# Patient Record
Sex: Male | Born: 1960 | Race: White | Hispanic: No | Marital: Married | Smoking: Former smoker
Health system: Southern US, Community
[De-identification: ages and names within clinical notes are randomized; demographics above are authoritative.]

## PROBLEM LIST (undated history)

## (undated) DIAGNOSIS — K5790 Diverticulosis of intestine, part unspecified, without perforation or abscess without bleeding: Secondary | ICD-10-CM

## (undated) DIAGNOSIS — B9681 Helicobacter pylori [H. pylori] as the cause of diseases classified elsewhere: Secondary | ICD-10-CM

## (undated) DIAGNOSIS — K2970 Gastritis, unspecified, without bleeding: Secondary | ICD-10-CM

## (undated) DIAGNOSIS — F419 Anxiety disorder, unspecified: Secondary | ICD-10-CM

## (undated) DIAGNOSIS — M1990 Unspecified osteoarthritis, unspecified site: Secondary | ICD-10-CM

## (undated) DIAGNOSIS — K219 Gastro-esophageal reflux disease without esophagitis: Secondary | ICD-10-CM

## (undated) DIAGNOSIS — I10 Essential (primary) hypertension: Secondary | ICD-10-CM

## (undated) DIAGNOSIS — K648 Other hemorrhoids: Secondary | ICD-10-CM

## (undated) DIAGNOSIS — F329 Major depressive disorder, single episode, unspecified: Secondary | ICD-10-CM

## (undated) DIAGNOSIS — J45909 Unspecified asthma, uncomplicated: Secondary | ICD-10-CM

## (undated) DIAGNOSIS — K449 Diaphragmatic hernia without obstruction or gangrene: Secondary | ICD-10-CM

## (undated) DIAGNOSIS — D126 Benign neoplasm of colon, unspecified: Secondary | ICD-10-CM

## (undated) DIAGNOSIS — K5792 Diverticulitis of intestine, part unspecified, without perforation or abscess without bleeding: Secondary | ICD-10-CM

## (undated) DIAGNOSIS — K222 Esophageal obstruction: Secondary | ICD-10-CM

## (undated) DIAGNOSIS — F32A Depression, unspecified: Secondary | ICD-10-CM

## (undated) HISTORY — DX: Gastro-esophageal reflux disease without esophagitis: K21.9

## (undated) HISTORY — DX: Helicobacter pylori (H. pylori) as the cause of diseases classified elsewhere: B96.81

## (undated) HISTORY — DX: Anxiety disorder, unspecified: F41.9

## (undated) HISTORY — DX: Diaphragmatic hernia without obstruction or gangrene: K44.9

## (undated) HISTORY — DX: Diverticulitis of intestine, part unspecified, without perforation or abscess without bleeding: K57.92

## (undated) HISTORY — PX: HEMORRHOID BANDING: SHX5850

## (undated) HISTORY — PX: UPPER GASTROINTESTINAL ENDOSCOPY: SHX188

## (undated) HISTORY — DX: Esophageal obstruction: K22.2

## (undated) HISTORY — DX: Major depressive disorder, single episode, unspecified: F32.9

## (undated) HISTORY — DX: Benign neoplasm of colon, unspecified: D12.6

## (undated) HISTORY — PX: TONSILLECTOMY AND ADENOIDECTOMY: SUR1326

## (undated) HISTORY — DX: Gastritis, unspecified, without bleeding: K29.70

## (undated) HISTORY — DX: Unspecified osteoarthritis, unspecified site: M19.90

## (undated) HISTORY — DX: Depression, unspecified: F32.A

## (undated) HISTORY — DX: Diverticulosis of intestine, part unspecified, without perforation or abscess without bleeding: K57.90

## (undated) HISTORY — DX: Unspecified asthma, uncomplicated: J45.909

## (undated) HISTORY — DX: Essential (primary) hypertension: I10

## (undated) HISTORY — PX: COLONOSCOPY: SHX174

## (undated) HISTORY — DX: Other hemorrhoids: K64.8

---

## 1985-10-07 HISTORY — PX: SPINAL FUSION: SHX223

## 2009-11-15 ENCOUNTER — Encounter (INDEPENDENT_AMBULATORY_CARE_PROVIDER_SITE_OTHER): Payer: Self-pay | Admitting: *Deleted

## 2010-11-06 NOTE — Letter (Signed)
Summary: Colonoscopy Date Change Letter  [facility]  [address] [place], [place] [address]   Phone: [phone]  Fax: [phone]      November 15, 2009 MRN: [id number]   Devon Bryant [address] [place], [place]  [address]   Dear Mr. PUGLIA,   Previously you were recommended to have a repeat colonoscopy around this time. Your chart was recently reviewed by Dr. Judie Petit T. Russella Dar of [facility]. Follow up colonoscopy is now recommended in March 2014. This revised recommendation is based on current, nationally recognized guidelines for colorectal cancer screening and polyp surveillance. These guidelines are endorsed by the American Cancer Society, [employer] on Colorectal Cancer as well as numerous other major medical organizations.  Please understand that our recommendation assumes that you do not have any new symptoms such as bleeding, a change in bowel habits, anemia, or significant abdominal discomfort. If you do have any concerning GI symptoms or want to discuss the guideline recommendations, please call to arrange an office visit at your earliest convenience. Otherwise we will keep you in our reminder system and contact you 1-2 months prior to the date listed above to schedule your next colonoscopy.  Thank you,  Judie Petit T. Russella Dar, M.D.  [facility] [phone]

## 2012-01-14 ENCOUNTER — Encounter: Payer: Self-pay | Admitting: Gastroenterology

## 2012-01-21 ENCOUNTER — Institutional Professional Consult (permissible substitution): Payer: Self-pay | Admitting: Cardiology

## 2012-02-06 ENCOUNTER — Ambulatory Visit: Payer: Self-pay | Admitting: Gastroenterology

## 2012-08-26 ENCOUNTER — Encounter: Payer: Self-pay | Admitting: Gastroenterology

## 2012-10-09 ENCOUNTER — Ambulatory Visit: Payer: Self-pay | Admitting: Gastroenterology

## 2012-12-04 ENCOUNTER — Encounter: Payer: Self-pay | Admitting: Gastroenterology

## 2014-01-17 ENCOUNTER — Telehealth: Payer: Self-pay | Admitting: Gastroenterology

## 2014-01-17 ENCOUNTER — Encounter: Payer: Self-pay | Admitting: Gastroenterology

## 2014-01-17 NOTE — Telephone Encounter (Signed)
Patient appt moved up to 02/14/14 330

## 2014-02-14 ENCOUNTER — Encounter: Payer: Self-pay | Admitting: Gastroenterology

## 2014-02-14 ENCOUNTER — Ambulatory Visit (INDEPENDENT_AMBULATORY_CARE_PROVIDER_SITE_OTHER): Payer: PRIVATE HEALTH INSURANCE | Admitting: Gastroenterology

## 2014-02-14 VITALS — BP 106/78 | HR 72 | Ht 72.0 in | Wt 215.4 lb

## 2014-02-14 DIAGNOSIS — Z1211 Encounter for screening for malignant neoplasm of colon: Secondary | ICD-10-CM

## 2014-02-14 DIAGNOSIS — K429 Umbilical hernia without obstruction or gangrene: Secondary | ICD-10-CM

## 2014-02-14 DIAGNOSIS — R1319 Other dysphagia: Secondary | ICD-10-CM

## 2014-02-14 DIAGNOSIS — K921 Melena: Secondary | ICD-10-CM

## 2014-02-14 DIAGNOSIS — K5900 Constipation, unspecified: Secondary | ICD-10-CM

## 2014-02-14 DIAGNOSIS — K219 Gastro-esophageal reflux disease without esophagitis: Secondary | ICD-10-CM

## 2014-02-14 MED ORDER — OMEPRAZOLE 40 MG PO CPDR: 40.0000 mg | DELAYED_RELEASE_CAPSULE | Freq: Every day | ORAL | Status: DC

## 2014-02-14 MED ORDER — PEG-KCL-NACL-NASULF-NA ASC-C 100 G PO SOLR: 1.0000 | Freq: Once | ORAL | Status: DC

## 2014-02-14 NOTE — Progress Notes (Signed)
    History of Present Illness: This is a 53 year old male who has multiple gastrointestinal complaints. He notes intermittent mild constipation for the past year. He occasionally notes small amounts of bright red blood with bowel movements especially when straining. He has had long-term GERD with frequent reflux symptoms and takes ranitidine 150 mg on occasion. For the past year he has had worsening problems with intermittent solid food dysphagia mainly meats and breads. He has had a self-induced vomiting on a few occasions. He relates an umbilical hernia that has increased in size. He notes occasional wheezing and shortness of breath. He states he underwent colonoscopy with me about 12 years ago but does not recall the findings. We can't locate his records at this time. Denies weight loss, abdominal pain, diarrhea, change in stool caliber, melena, hematochezia, nausea, vomiting, chest pain.  Review of Systems: Pertinent positive and negative review of systems were noted in the above HPI section. All other review of systems were otherwise negative.  Current Medications, Allergies, Past Medical History, Past Surgical History, Family History and Social History were reviewed in [employer] record.  Physical Exam: General: Well developed , well nourished, no acute distress Head: Normocephalic and atraumatic Eyes:  sclerae anicteric, EOMI Ears: Normal auditory acuity Mouth: No deformity or lesions Neck: Supple, no masses or thyromegaly Lungs: Clear throughout to auscultation Heart: Regular rate and rhythm; no murmurs, rubs or bruits Abdomen: Soft, non tender and non distended. No masses, hepatosplenomegaly noted. Small tender umbilicus hernia. Normal Bowel sounds Rectal: Deferred to colonoscopy Musculoskeletal: Symmetrical with no gross deformities  Skin: No lesions on visible extremities Pulses:  Normal pulses noted Extremities: No clubbing, cyanosis, edema or deformities  noted Neurological: Alert oriented x 4, grossly nonfocal Cervical Nodes:  No significant cervical adenopathy Inguinal Nodes: No significant inguinal adenopathy Psychological:  Alert and cooperative. Normal mood and affect  Assessment and Recommendations:  1. GERD with progressive solid food dysphagia. Rule out peptic stricture. Begin omeprazole 40 mg daily and standard antireflux measures. Schedule barium esophagram. Schedule endoscopy with possible dilation. The risks, benefits, and alternatives to endoscopy with possible biopsy and possible dilation were discussed with the patient and they consent to proceed.   2. Colorectal cancer screening, average risk. I suspect his occasional small volume rectal bleeding is secondary to a benign anorectal source. Increase dietary fiber and water intake. The risks, benefits, and alternatives to colonoscopy with possible biopsy and possible polypectomy were discussed with the patient and they consent to proceed.   3. Small umbilical hernia. Surgical referral after above gastrointestinal evaluation is completed.

## 2014-02-14 NOTE — Patient Instructions (Signed)
We have sent the following medications to your pharmacy for you to pick up at your convenience:omeprazole.  Patient advised to avoid spicy, acidic, citrus, chocolate, mints, fruit and fruit juices.  Limit the intake of caffeine, alcohol and Soda.  Don't exercise too soon after eating.  Don't lie down within 3-4 hours of eating.  Elevate the head of your bed.  Avoid meats and breads until Upper Endoscopy.   You have been scheduled for a Barium Esophogram at [facility] Radiology (1st floor of the hospital) on 02/25/14 at 9:30am. Please arrive 15 minutes prior to your appointment for registration. Make certain not to have anything to eat or drink 6 hours prior to your test. If you need to reschedule for any reason, please contact radiology at [phone] to do so. __________________________________________________________________ A barium swallow is an examination that concentrates on views of the esophagus. This tends to be a double contrast exam (barium and two liquids which, when combined, create a gas to distend the wall of the oesophagus) or single contrast (non-ionic iodine based). The study is usually tailored to your symptoms so a good history is essential. Attention is paid during the study to the form, structure and configuration of the esophagus, looking for functional disorders (such as aspiration, dysphagia, achalasia, motility and reflux) EXAMINATION You may be asked to change into a gown, depending on the type of swallow being performed. A radiologist and radiographer will perform the procedure. The radiologist will advise you of the type of contrast selected for your procedure and direct you during the exam. You will be asked to stand, sit or lie in several different positions and to hold a small amount of fluid in your mouth before being asked to swallow while the imaging is performed .In some instances you may be asked to swallow barium coated marshmallows to assess the motility of a solid  food bolus. The exam can be recorded as a digital or video fluoroscopy procedure. POST PROCEDURE It will take 1-2 days for the barium to pass through your system. To facilitate this, it is important, unless otherwise directed, to increase your fluids for the next 24-48hrs and to resume your normal diet.  This test typically takes about 30 minutes to perform. __________________________________________________________________________________  Devon Bryant have been scheduled for an endoscopy and colonoscopy with propofol. Please follow the written instructions given to you at your visit today. Please pick up your prep at the pharmacy within the next 1-3 days. If you use inhalers (even only as needed), please bring them with you on the day of your procedure. Your physician has requested that you go to www.startemmi.com and enter the access code given to you at your visit today. This web site gives a general overview about your procedure. However, you should still follow specific instructions given to you by our office regarding your preparation for the procedure.  Please make an appointment with your Primary Care Physician to be evaluated for shortness of breath and wheezing.  Thank you for choosing me and [facility].  Pricilla Riffle. Dagoberto Ligas., MD., Mar[facility] Regal

## 2014-02-25 ENCOUNTER — Ambulatory Visit (HOSPITAL_COMMUNITY)
Admission: RE | Admit: 2014-02-25 | Discharge: 2014-02-25 | Disposition: A | Discharge status: Home / Self Care | Payer: PRIVATE HEALTH INSURANCE | Source: Ambulatory Visit | Attending: Gastroenterology | Admitting: Gastroenterology

## 2014-02-25 DIAGNOSIS — K219 Gastro-esophageal reflux disease without esophagitis: Secondary | ICD-10-CM | POA: Insufficient documentation

## 2014-02-25 DIAGNOSIS — K449 Diaphragmatic hernia without obstruction or gangrene: Secondary | ICD-10-CM | POA: Insufficient documentation

## 2014-02-25 DIAGNOSIS — R1319 Other dysphagia: Secondary | ICD-10-CM

## 2014-02-25 DIAGNOSIS — R1310 Dysphagia, unspecified: Secondary | ICD-10-CM | POA: Insufficient documentation

## 2014-03-07 DIAGNOSIS — D126 Benign neoplasm of colon, unspecified: Secondary | ICD-10-CM

## 2014-03-07 HISTORY — DX: Benign neoplasm of colon, unspecified: D12.6

## 2014-03-09 ENCOUNTER — Ambulatory Visit: Payer: Self-pay | Admitting: Gastroenterology

## 2014-03-29 ENCOUNTER — Encounter: Payer: Self-pay | Admitting: Gastroenterology

## 2014-03-29 ENCOUNTER — Ambulatory Visit (AMBULATORY_SURGERY_CENTER): Payer: PRIVATE HEALTH INSURANCE | Admitting: Gastroenterology

## 2014-03-29 VITALS — BP 109/70 | HR 60 | Temp 97.0°F | Resp 16 | Ht 72.0 in | Wt 215.0 lb

## 2014-03-29 DIAGNOSIS — R1319 Other dysphagia: Secondary | ICD-10-CM

## 2014-03-29 DIAGNOSIS — K2990 Gastroduodenitis, unspecified, without bleeding: Secondary | ICD-10-CM

## 2014-03-29 DIAGNOSIS — D126 Benign neoplasm of colon, unspecified: Secondary | ICD-10-CM

## 2014-03-29 DIAGNOSIS — Z1211 Encounter for screening for malignant neoplasm of colon: Secondary | ICD-10-CM

## 2014-03-29 DIAGNOSIS — K2970 Gastritis, unspecified, without bleeding: Secondary | ICD-10-CM

## 2014-03-29 DIAGNOSIS — K222 Esophageal obstruction: Secondary | ICD-10-CM

## 2014-03-29 DIAGNOSIS — D122 Benign neoplasm of ascending colon: Secondary | ICD-10-CM

## 2014-03-29 DIAGNOSIS — K219 Gastro-esophageal reflux disease without esophagitis: Secondary | ICD-10-CM

## 2014-03-29 MED ORDER — SODIUM CHLORIDE 0.9 % IV SOLN: 500.0000 mL | INTRAVENOUS | Status: DC

## 2014-03-29 NOTE — Op Note (Signed)
[place]  [address] [place], [address]   COLONOSCOPY PROCEDURE REPORT PATIENT: Devon, Bryant  MR#: [id number] BIRTHDATE: [birth year] , 71  yrs. old GENDER: Male ENDOSCOPIST: Ladene Artist, MD, [facility] PROCEDURE DATE:  03/29/2014 PROCEDURE:   Colonoscopy with biopsy and snare polypectomy First Screening Colonoscopy - Avg.  risk and is 50 yrs.  old or older Yes.  Prior Negative Screening - Now for repeat screening. N/A  History of Adenoma - Now for follow-up colonoscopy & has been > or = to 3 yrs.  N/A  Polyps Removed Today? Yes. ASA CLASS:   Class II INDICATIONS:average risk screening. MEDICATIONS: MAC sedation, administered by CRNA and propofol (Diprivan) 240mg  IV DESCRIPTION OF PROCEDURE:   After the risks benefits and alternatives of the procedure were thoroughly explained, informed consent was obtained.  A digital rectal exam revealed no abnormalities of the rectum.   The LB [id number]  endoscope was introduced through the anus and advanced to the cecum, which was identified by both the appendix and ileocecal valve. No adverse events experienced.   The quality of the prep was good, using MoviPrep  The instrument was then slowly withdrawn as the colon was fully examined.  COLON FINDINGS: A sessile polyp measuring 3 mm in size was found in the ascending colon.  A polypectomy was performed with cold forceps.  The resection was complete and the polyp tissue was completely retrieved.   A sessile polyp measuring 1 cm in size was found in the transverse colon.  A polypectomy was performed using snare cautery.  The resection was complete and the polyp tissue was completely retrieved.   Moderate diverticulosis was noted in the descending colon and sigmoid colon.   The colon was otherwise normal.  There was no diverticulosis, inflammation, polyps or cancers unless previously stated.  Retroflexed views revealed moderate  internal hemorrhoids. The time to  cecum=1 minutes 35 seconds.  Withdrawal time=10 minutes 19 seconds.  The scope was withdrawn and the procedure completed. COMPLICATIONS: There were no complications. ENDOSCOPIC IMPRESSION: 1.   Sessile polyp measuring 3 mm in the ascending colon; polypectomy performed with cold forceps 2.   Sessile polyp measuring 1 cm in the transverse colon; polypectomy performed using snare cautery 3.   Moderate diverticulosis in the descending colon and sigmoid colon 4.   Moderate internal hemorrhoids  RECOMMENDATIONS: 1.  Await pathology results 2.  Hold aspirin, aspirin products, and anti-inflammatory medication for 2 Kamalei Roeder. 3.  High fiber diet with liberal fluid intake. 4.  Repeat colonoscopy in 3 years if the larger polyp is adenomatous; 5 years if only the smaller polyp is adenomatous; otherwise 10 years  eSigned:  Ladene Artist, MD, [facility] 03/29/2014 1:45 PM

## 2014-03-29 NOTE — Progress Notes (Signed)
Called to room to assist during endoscopic procedure.  Patient ID and intended procedure confirmed with present staff. Received instructions for my participation in the procedure from the performing physician.  

## 2014-03-29 NOTE — Patient Instructions (Signed)
YOU HAD AN ENDOSCOPIC PROCEDURE TODAY AT THE [facility]: Refer to the procedure report that was given to you for any specific questions about what was found during the examination.  If the procedure report does not answer your questions, please call your gastroenterologist to clarify.  If you requested that your care partner not be given the details of your procedure findings, then the procedure report has been included in a sealed envelope for you to review at your convenience later.  YOU SHOULD EXPECT: Some feelings of bloating in the abdomen. Passage of more gas than usual.  Walking can help get rid of the air that was put into your GI tract during the procedure and reduce the bloating. If you had a lower endoscopy (such as a colonoscopy or flexible sigmoidoscopy) you may notice spotting of blood in your stool or on the toilet paper. If you underwent a bowel prep for your procedure, then you may not have a normal bowel movement for a few days.  DIET: Your first meal following the procedure should be a light meal and then it is ok to progress to your normal diet.  A half-sandwich or bowl of soup is an example of a good first meal.  Heavy or fried foods are harder to digest and may make you feel nauseous or bloated.  Likewise meals heavy in dairy and vegetables can cause extra gas to form and this can also increase the bloating.  Drink plenty of fluids but you should avoid alcoholic beverages for 24 hours.  ACTIVITY: Your care partner should take you home directly after the procedure.  You should plan to take it easy, moving slowly for the rest of the day.  You can resume normal activity the day after the procedure however you should NOT DRIVE or use heavy machinery for 24 hours (because of the sedation medicines used during the test).    SYMPTOMS TO REPORT IMMEDIATELY: A gastroenterologist can be reached at any hour.  During normal business hours, 8:30 AM to 5:00 PM Monday through Friday,  call [phone].  After hours and on weekends, please call the GI answering service at [phone] who will take a message and have the physician on call contact you.   Following lower endoscopy (colonoscopy or flexible sigmoidoscopy):  Excessive amounts of blood in the stool  Significant tenderness or worsening of abdominal pains  Swelling of the abdomen that is new, acute  Fever of 100F or higher  Following upper endoscopy (EGD)  Vomiting of blood or coffee ground material  New chest pain or pain under the shoulder blades  Painful or persistently difficult swallowing  New shortness of breath  Fever of 100F or higher  Black, tarry-looking stools  FOLLOW UP: If any biopsies were taken you will be contacted by phone or by letter within the next 1-3 weeks.  Call your gastroenterologist if you have not heard about the biopsies in 3 weeks.  Our staff will call the home number listed on your records the next business day following your procedure to check on you and address any questions or concerns that you may have at that time regarding the information given to you following your procedure. This is a courtesy call and so if there is no answer at the home number and we have not heard from you through the emergency physician on call, we will assume that you have returned to your regular daily activities without incident.  SIGNATURES/CONFIDENTIALITY: You and/or your care   partner have signed paperwork which will be entered into your electronic medical record.  These signatures attest to the fact that that the information above on your After Visit Summary has been reviewed and is understood.  Full responsibility of the confidentiality of this discharge information lies with you and/or your care-partner.YOU HAD AN ENDOSCOPIC PROCEDURE TODAY AT [facility]: Refer to the procedure report that was given to you for any specific questions about what was found during the examination.   If the procedure report does not answer your questions, please call your gastroenterologist to clarify.  If you requested that your care partner not be given the details of your procedure findings, then the procedure report has been included in a sealed envelope for you to review at your convenience later.  YOU SHOULD EXPECT: Some feelings of bloating in the abdomen. Passage of more gas than usual.  Walking can help get rid of the air that was put into your GI tract during the procedure and reduce the bloating. If you had a lower endoscopy (such as a colonoscopy or flexible sigmoidoscopy) you may notice spotting of blood in your stool or on the toilet paper. If you underwent a bowel prep for your procedure, then you may not have a normal bowel movement for a few days.  DIET: Your first meal following the procedure should be a light meal and then it is ok to progress to your normal diet.  A half-sandwich or bowl of soup is an example of a good first meal.  Heavy or fried foods are harder to digest and may make you feel nauseous or bloated.  Likewise meals heavy in dairy and vegetables can cause extra gas to form and this can also increase the bloating.  Drink plenty of fluids but you should avoid alcoholic beverages for 24 hours.  ACTIVITY: Your care partner should take you home directly after the procedure.  You should plan to take it easy, moving slowly for the rest of the day.  You can resume normal activity the day after the procedure however you should NOT DRIVE or use heavy machinery for 24 hours (because of the sedation medicines used during the test).    SYMPTOMS TO REPORT IMMEDIATELY: A gastroenterologist can be reached at any hour.  During normal business hours, 8:30 AM to 5:00 PM Monday through Friday, call [phone].  After hours and on weekends, please call the GI answering service at [phone] who will take a message and have the physician on call contact you.   Following lower  endoscopy (colonoscopy or flexible sigmoidoscopy):  Excessive amounts of blood in the stool  Significant tenderness or worsening of abdominal pains  Swelling of the abdomen that is new, acute  Fever of 100F or higher  Following upper endoscopy (EGD)  Vomiting of blood or coffee ground material  New chest pain or pain under the shoulder blades  Painful or persistently difficult swallowing  New shortness of breath  Fever of 100F or higher  Black, tarry-looking stools  FOLLOW UP: If any biopsies were taken you will be contacted by phone or by letter within the next 1-3 weeks.  Call your gastroenterologist if you have not heard about the biopsies in 3 weeks.  Our staff will call the home number listed on your records the next business day following your procedure to check on you and address any questions or concerns that you may have at that time regarding the information given to you  following your procedure. This is a courtesy call and so if there is no answer at the home number and we have not heard from you through the emergency physician on call, we will assume that you have returned to your regular daily activities without incident.  SIGNATURES/CONFIDENTIALITY: You and/or your care partner have signed paperwork which will be entered into your electronic medical record.  These signatures attest to the fact that that the information above on your After Visit Summary has been reviewed and is understood.  Full responsibility of the confidentiality of this discharge information lies with you and/or your care-partner.  Stricture, gastrititis, and hiatus hernia information given.  Post dilation instructions given.  See Dr. Fuller Plan for office visit in 4-6 hours.  Polyp, diverticulosis, hemorrhoid and high fiber diet information given.  Liberal fluid intake encouraged.  Hold aspirin, aspirin products, and anti-inflammatories for 2 weeks.  Dr. Fuller Plan will advise you about next colonoscopy  after pathology reports are reviewed.

## 2014-03-29 NOTE — Progress Notes (Signed)
Report to PACU, RN, vss, BBS= Clear.  

## 2014-03-29 NOTE — Op Note (Signed)
[place]  [address] [place], [address]   ENDOSCOPY PROCEDURE REPORT  PATIENT: Devon, Bryant  MR#: [id number] BIRTHDATE: May 24, 1961 , 51  yrs. old GENDER: Male ENDOSCOPIST: Ladene Artist, MD, [facility] PROCEDURE DATE:  03/29/2014 PROCEDURE:   EGD with biopsy and EGD with dilatation over guidewire  ASA CLASS:   Class II INDICATIONS:dysphagia and GERD. MEDICATIONS: MAC sedation, administered by CRNA, There was residual sedation effect present from prior procedure, and propofol (Diprivan) 100mg  IV TOPICAL ANESTHETIC:   none DESCRIPTION OF PROCEDURE:   After the risks benefits and alternatives of the procedure were thoroughly explained, informed consent was obtained.  The     endoscope was introduced through the mouth  and advanced to the descending duodenum ,      The instrument was slowly withdrawn as the mucosa was carefully examined.  ESOPHAGUS: A stricture was found at the gastroesophageal junction. The stenosis was traversable with the endoscope.   The esophagus was otherwise normal. STOMACH: Mild gastritis  was found in the gastric body.  Multiple biopsies were performed.   The stomach otherwise appeared normal. A small hiatus hernia was found on retroflexed view. DUODENUM: The duodenal mucosa showed no abnormalities in the bulb and second portion of the duodenum.     Dilation was then performed at the gastroesphageal junction Dilator:Savary over guidewire Size:13, 14, 15 mm  Reststance:minimal Heme:yes-minimal on last 2 dilators  COMPLICATIONS: There were no complications. ENDOSCOPIC IMPRESSION: 1.    Stricture at the gastroesophageal junction; dilated 2.    Gastritis in the gastric body; multiple biopsies 3.    Hiatus hernia  RECOMMENDATIONS: 1.  anti-reflux regimen long term 2.  await pathology results 3.  PPI qam long term 4.  post dilation instructions 5.  OP follow-up in 4-6 weeks.  eSigned:  Ladene Artist, MD, [facility] 03/29/2014 2:04  PM

## 2014-03-30 ENCOUNTER — Telehealth: Payer: Self-pay | Admitting: *Deleted

## 2014-03-30 NOTE — Telephone Encounter (Signed)
  Follow up Call-  Call back number 03/29/2014  Post procedure Call Back phone  # [phone]  Permission to leave phone message Yes     Patient questions:  Do you have a fever, pain , or abdominal swelling? no Pain Score  0 *  Have you tolerated food without any problems? yes  Have you been able to return to your normal activities? yes  Do you have any questions about your discharge instructions: Diet   no Medications  no Follow up visit  no  Do you have questions or concerns about your Care? no  Actions: * If pain score is 4 or above: No action needed, pain <4.

## 2014-04-05 ENCOUNTER — Telehealth: Payer: Self-pay | Admitting: Gastroenterology

## 2014-04-05 NOTE — Telephone Encounter (Signed)
Advised he had noncancerous polyps and positive h-pylori per Amy Esterwood. Advised he will be contacted again with more information after the results are reviewed by Dr Fuller Plan

## 2014-04-09 ENCOUNTER — Encounter: Payer: Self-pay | Admitting: Gastroenterology

## 2014-04-11 ENCOUNTER — Telehealth: Payer: Self-pay

## 2014-04-11 ENCOUNTER — Telehealth: Payer: Self-pay | Admitting: Gastroenterology

## 2014-04-11 ENCOUNTER — Other Ambulatory Visit: Payer: Self-pay

## 2014-04-11 MED ORDER — OMEPRAZOLE 40 MG PO CPDR: 40.0000 mg | DELAYED_RELEASE_CAPSULE | Freq: Two times a day (BID) | ORAL | Status: DC

## 2014-04-11 MED ORDER — BIS SUBCIT-METRONID-TETRACYC 140-125-125 MG PO CAPS: 3.0000 | ORAL_CAPSULE | Freq: Three times a day (TID) | ORAL | Status: DC

## 2014-04-11 MED ORDER — AMOXICILLIN 500 MG PO CAPS: ORAL_CAPSULE | ORAL | Status: DC

## 2014-04-11 MED ORDER — CLARITHROMYCIN 500 MG PO TABS: 500.0000 mg | ORAL_TABLET | Freq: Two times a day (BID) | ORAL | Status: DC

## 2014-04-11 NOTE — Telephone Encounter (Signed)
Prescription resent for # 120 of Pylera. Pharmacy states prescription is still over 600 dollars and patient can afford medication. We do not have a rep for this medication or samples currently. Can we break down generic medication?

## 2014-04-11 NOTE — Telephone Encounter (Signed)
Patient instructed.

## 2014-04-11 NOTE — Telephone Encounter (Signed)
Message copied by Greggory Keen on Mon Apr 11, 2014  8:24 AM ------      Message from: Lucio Edward T      Created: Sat Apr 09, 2014  9:16 AM       Gastric biopsies showed H pylori      Pylera 3 po qid for 10 days      PPI po bid for 10 days ------

## 2014-04-11 NOTE — Telephone Encounter (Signed)
We sent Amoxicillin 1000 mg twice daily, clarithromycin 500 twice daily, and omeprazole 40 mg twice daily x 10 days in place of the Pylera.

## 2014-04-11 NOTE — Telephone Encounter (Signed)
Yes please break into its component meds.

## 2014-05-09 ENCOUNTER — Telehealth: Payer: Self-pay | Admitting: Gastroenterology

## 2014-05-09 ENCOUNTER — Ambulatory Visit: Payer: PRIVATE HEALTH INSURANCE | Admitting: Gastroenterology

## 2014-05-09 NOTE — Telephone Encounter (Signed)
I understand but still charge.

## 2014-05-09 NOTE — Telephone Encounter (Signed)
Do you want to charge? 

## 2015-10-23 ENCOUNTER — Encounter: Payer: Self-pay | Admitting: Gastroenterology

## 2016-04-29 ENCOUNTER — Encounter (HOSPITAL_BASED_OUTPATIENT_CLINIC_OR_DEPARTMENT_OTHER): Payer: PRIVATE HEALTH INSURANCE | Attending: Internal Medicine

## 2016-04-29 DIAGNOSIS — L03115 Cellulitis of right lower limb: Secondary | ICD-10-CM | POA: Insufficient documentation

## 2016-04-29 DIAGNOSIS — W228XXA Striking against or struck by other objects, initial encounter: Secondary | ICD-10-CM | POA: Diagnosis not present

## 2016-04-29 DIAGNOSIS — S81811A Laceration without foreign body, right lower leg, initial encounter: Secondary | ICD-10-CM | POA: Insufficient documentation

## 2016-05-06 DIAGNOSIS — S81811A Laceration without foreign body, right lower leg, initial encounter: Secondary | ICD-10-CM | POA: Diagnosis not present

## 2016-05-13 ENCOUNTER — Encounter (HOSPITAL_BASED_OUTPATIENT_CLINIC_OR_DEPARTMENT_OTHER): Payer: Self-pay

## 2016-05-13 ENCOUNTER — Encounter (HOSPITAL_BASED_OUTPATIENT_CLINIC_OR_DEPARTMENT_OTHER): Payer: PRIVATE HEALTH INSURANCE | Attending: Internal Medicine

## 2016-05-13 DIAGNOSIS — S81801A Unspecified open wound, right lower leg, initial encounter: Secondary | ICD-10-CM | POA: Diagnosis not present

## 2016-05-13 DIAGNOSIS — W228XXA Striking against or struck by other objects, initial encounter: Secondary | ICD-10-CM | POA: Insufficient documentation

## 2017-01-21 ENCOUNTER — Encounter: Payer: Self-pay | Admitting: Gastroenterology

## 2017-02-07 ENCOUNTER — Encounter: Payer: Self-pay | Admitting: Gastroenterology

## 2017-02-26 ENCOUNTER — Ambulatory Visit (AMBULATORY_SURGERY_CENTER): Payer: Self-pay | Admitting: *Deleted

## 2017-02-26 VITALS — Ht 72.0 in | Wt 235.2 lb

## 2017-02-26 DIAGNOSIS — Z86010 Personal history of colonic polyps: Secondary | ICD-10-CM

## 2017-02-26 MED ORDER — NA SULFATE-K SULFATE-MG SULF 17.5-3.13-1.6 GM/177ML PO SOLN: ORAL | 0 refills | Status: DC

## 2017-02-26 NOTE — Progress Notes (Signed)
No allergies to eggs or soy. No problems with anesthesia.  Pt given Emmi instructions for colonoscopy  No oxygen use  No diet drug use  

## 2017-03-18 ENCOUNTER — Ambulatory Visit (AMBULATORY_SURGERY_CENTER): Payer: PRIVATE HEALTH INSURANCE | Admitting: Gastroenterology

## 2017-03-18 ENCOUNTER — Encounter: Payer: Self-pay | Admitting: Gastroenterology

## 2017-03-18 VITALS — BP 125/83 | HR 63 | Temp 96.6°F | Resp 18 | Ht 72.0 in | Wt 215.0 lb

## 2017-03-18 DIAGNOSIS — D120 Benign neoplasm of cecum: Secondary | ICD-10-CM

## 2017-03-18 DIAGNOSIS — D123 Benign neoplasm of transverse colon: Secondary | ICD-10-CM

## 2017-03-18 DIAGNOSIS — Z86010 Personal history of colonic polyps: Secondary | ICD-10-CM

## 2017-03-18 DIAGNOSIS — D126 Benign neoplasm of colon, unspecified: Secondary | ICD-10-CM

## 2017-03-18 MED ORDER — SODIUM CHLORIDE 0.9 % IV SOLN: 500.0000 mL | INTRAVENOUS | Status: AC

## 2017-03-18 NOTE — Patient Instructions (Signed)
YOU HAD AN ENDOSCOPIC PROCEDURE TODAY AT THE [facility]:   Refer to the procedure report that was given to you for any specific questions about what was found during the examination.  If the procedure report does not answer your questions, please call your gastroenterologist to clarify.  If you requested that your care partner not be given the details of your procedure findings, then the procedure report has been included in a sealed envelope for you to review at your convenience later.  YOU SHOULD EXPECT: Some feelings of bloating in the abdomen. Passage of more gas than usual.  Walking can help get rid of the air that was put into your GI tract during the procedure and reduce the bloating. If you had a lower endoscopy (such as a colonoscopy or flexible sigmoidoscopy) you may notice spotting of blood in your stool or on the toilet paper. If you underwent a bowel prep for your procedure, you may not have a normal bowel movement for a few days.  Please Note:  You might notice some irritation and congestion in your nose or some drainage.  This is from the oxygen used during your procedure.  There is no need for concern and it should clear up in a day or so.  SYMPTOMS TO REPORT IMMEDIATELY:   Following lower endoscopy (colonoscopy or flexible sigmoidoscopy):  Excessive amounts of blood in the stool  Significant tenderness or worsening of abdominal pains  Swelling of the abdomen that is new, acute  Fever of 100F or higher   For urgent or emergent issues, a gastroenterologist can be reached at any hour by calling [phone].   DIET:  We do recommend a small meal at first, but then you may proceed to your regular diet.  Drink plenty of fluids but you should avoid alcoholic beverages for 24 hours. Try to increase the fiber in your diet, and drink plenty of water.  ACTIVITY:  You should plan to take it easy for the rest of today and you should NOT DRIVE or use heavy machinery until  tomorrow (because of the sedation medicines used during the test).    FOLLOW UP: Our staff will call the number listed on your records the next business day following your procedure to check on you and address any questions or concerns that you may have regarding the information given to you following your procedure. If we do not reach you, we will leave a message.  However, if you are feeling well and you are not experiencing any problems, there is no need to return our call.  We will assume that you have returned to your regular daily activities without incident.  If any biopsies were taken you will be contacted by phone or by letter within the next 1-3 weeks.  Please call us at [phone] if you have not heard about the biopsies in 3 weeks.    SIGNATURES/CONFIDENTIALITY: You and/or your care partner have signed paperwork which will be entered into your electronic medical record.  These signatures attest to the fact that that the information above on your After Visit Summary has been reviewed and is understood.  Full responsibility of the confidentiality of this discharge information lies with you and/or your care-partner.  Read all of the handouts given to you by your recovery room nurse. 

## 2017-03-18 NOTE — Progress Notes (Signed)
A and O x3. Report to RN. Tolerated MAC anesthesia well.

## 2017-03-18 NOTE — Op Note (Signed)
[facility] Patient Name: Devon Bryant Procedure Date: 03/18/2017 3:26 PM MRN: [id number] Endoscopist: Ladene Artist , MD Age: 56 Referring MD:  Date of Birth: [birth year] Gender: Male Account #: [id number] Procedure:                Colonoscopy Indications:              Surveillance: Personal history of adenomatous                            polyps on last colonoscopy 3 years ago Medicines:                Monitored Anesthesia Care Procedure:                Pre-Anesthesia Assessment:                           - Prior to the procedure, a History and Physical                            was performed, and patient medications and                            allergies were reviewed. The patient's tolerance of                            previous anesthesia was also reviewed. The risks                            and benefits of the procedure and the sedation                            options and risks were discussed with the patient.                            All questions were answered, and informed consent                            was obtained. Prior Anticoagulants: The patient has                            taken no previous anticoagulant or antiplatelet                            agents. ASA Grade Assessment: II - A patient with                            mild systemic disease. After reviewing the risks                            and benefits, the patient was deemed in                            satisfactory condition to undergo the procedure.  After obtaining informed consent, the colonoscope                            was passed under direct vision. Throughout the                            procedure, the patient's blood pressure, pulse, and                            oxygen saturations were monitored continuously. The                            Colonoscope was introduced through the anus and                            advanced to the the cecum,  identified by                            appendiceal orifice and ileocecal valve. The                            ileocecal valve, appendiceal orifice, and rectum                            were photographed. The quality of the bowel                            preparation was good. The colonoscopy was performed                            without difficulty. The patient tolerated the                            procedure well. Scope In: 3:34:45 PM Scope Out: 3:47:50 PM Scope Withdrawal Time: 0 hours 11 minutes 35 seconds  Total Procedure Duration: 0 hours 13 minutes 5 seconds  Findings:                 The perianal and digital rectal examinations were                            normal.                           A 6 mm polyp was found in the cecum. The polyp was                            sessile. The polyp was removed with a cold snare.                            Resection and retrieval were complete.                           Four sessile polyps were found in the transverse  colon and ileocecal valve. The polyps were 4 to 5                            mm in size. These polyps were removed with a cold                            biopsy forceps. Resection and retrieval were                            complete.                           Multiple medium-mouthed diverticula were found in                            the left colon. There was evidence of diverticular                            spasm.                           Internal hemorrhoids were found during                            retroflexion. The hemorrhoids were medium-sized and                            Grade I (internal hemorrhoids that do not prolapse).                           The exam was otherwise without abnormality on                            direct and retroflexion views. Complications:            No immediate complications. Estimated blood loss:                            None. Estimated Blood  Loss:     Estimated blood loss: none. Impression:               - One 6 mm polyp in the cecum, removed with a cold                            snare. Resected and retrieved.                           - Four 4 to 5 mm polyps in the transverse colon and                            at the ileocecal valve, removed with a cold biopsy                            forceps. Resected and retrieved.                           -  Moderate diverticulosis in the left colon.                           - Internal hemorrhoids.                           - The examination was otherwise normal on direct                            and retroflexion views. Recommendation:           - Repeat colonoscopy in 5 years for surveillance.                           - Patient has a contact number available for                            emergencies. The signs and symptoms of potential                            delayed complications were discussed with the                            patient. Return to normal activities tomorrow.                            Written discharge instructions were provided to the                            patient.                           - Continue present medications.                           - Await pathology results.                           - High fiber diet indefinitely. Ladene Artist, MD 03/18/2017 3:51:17 PM This report has been signed electronically.

## 2017-03-18 NOTE — Progress Notes (Signed)
Called to room to assist during endoscopic procedure.  Patient ID and intended procedure confirmed with present staff. Received instructions for my participation in the procedure from the performing physician.  

## 2017-03-19 ENCOUNTER — Telehealth: Payer: Self-pay | Admitting: *Deleted

## 2017-03-19 NOTE — Telephone Encounter (Signed)
  Follow up Call-  Call back number 03/18/2017  Post procedure Call Back phone  # [phone]  Permission to leave phone message Yes  Some recent data might be hidden     Patient questions:  Do you have a fever, pain , or abdominal swelling? No. Pain Score  0 *  Have you tolerated food without any problems? Yes.    Have you been able to return to your normal activities? Yes.    Do you have any questions about your discharge instructions: Diet   No. Medications  No. Follow up visit  No.  Do you have questions or concerns about your Care? No.  Actions: * If pain score is 4 or above: No action needed, pain <4.

## 2017-03-25 ENCOUNTER — Encounter: Payer: Self-pay | Admitting: Gastroenterology

## 2017-06-27 ENCOUNTER — Telehealth: Payer: Self-pay | Admitting: Emergency Medicine

## 2017-06-27 NOTE — Telephone Encounter (Signed)
Spoke with pt's wife and advised that we did not have any open availability sooner than the appt already made with RB. She understood and will try to call back every couples days to see if someone drops off his schedule. Nothing further is needed.

## 2017-08-04 ENCOUNTER — Encounter: Payer: Self-pay | Admitting: Internal Medicine

## 2017-08-04 ENCOUNTER — Ambulatory Visit (INDEPENDENT_AMBULATORY_CARE_PROVIDER_SITE_OTHER): Payer: Managed Care, Other (non HMO) | Admitting: Internal Medicine

## 2017-08-04 VITALS — BP 122/84 | HR 101 | Ht 72.0 in | Wt 237.2 lb

## 2017-08-04 DIAGNOSIS — R0602 Shortness of breath: Secondary | ICD-10-CM

## 2017-08-04 DIAGNOSIS — R0789 Other chest pain: Secondary | ICD-10-CM | POA: Diagnosis not present
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
## 2017-08-04 NOTE — Patient Instructions (Signed)
Medication Instructions:  Your physician recommends that you continue on your current medications as directed. Please refer to the Current Medication list given to you today.   Labwork: None   Testing/Procedures: Your physician has requested that you have en exercise stress myoview. For further information please visit [email or web address]. Please follow instruction sheet, as given.  Your physician has requested that you have an echocardiogram. Echocardiography is a painless test that uses sound waves to create images of your heart. It provides your doctor with information about the size and shape of your heart and how well your heart's chambers and valves are working. This procedure takes approximately one hour. There are no restrictions for this procedure.     Follow-Up: Your physician recommends that you schedule a follow-up appointment in: 1 month with Dr End--it is okay to schedule in a hold slot    Any Other Special Instructions Will Be Listed Below (If Applicable).     If you need a refill on your cardiac medications before your next appointment, please call your pharmacy.

## 2017-08-05 ENCOUNTER — Telehealth (HOSPITAL_COMMUNITY): Payer: Self-pay | Admitting: *Deleted

## 2017-08-05 DIAGNOSIS — J4489 Other specified chronic obstructive pulmonary disease: Secondary | ICD-10-CM | POA: Insufficient documentation

## 2017-08-05 DIAGNOSIS — R0789 Other chest pain: Secondary | ICD-10-CM | POA: Insufficient documentation

## 2017-08-05 DIAGNOSIS — J449 Chronic obstructive pulmonary disease, unspecified: Secondary | ICD-10-CM | POA: Insufficient documentation

## 2017-08-05 NOTE — Telephone Encounter (Signed)
Patient given detailed instructions per Myocardial Perfusion Study Information Sheet for the test on 08/07/17 at 7:30. Patient notified to arrive 15 minutes early and that it is imperative to arrive on time for appointment to keep from having the test rescheduled.  If you need to cancel or reschedule your appointment, please call the office within 24 hours of your appointment. . Patient verbalized understanding.Devon Bryant

## 2017-08-06 ENCOUNTER — Institutional Professional Consult (permissible substitution): Payer: PRIVATE HEALTH INSURANCE | Admitting: Emergency Medicine

## 2017-08-07 ENCOUNTER — Other Ambulatory Visit: Payer: Self-pay

## 2017-08-07 ENCOUNTER — Ambulatory Visit (HOSPITAL_COMMUNITY): Payer: Managed Care, Other (non HMO) | Attending: Cardiovascular Disease

## 2017-08-07 ENCOUNTER — Ambulatory Visit (INDEPENDENT_AMBULATORY_CARE_PROVIDER_SITE_OTHER): Payer: Managed Care, Other (non HMO) | Admitting: Emergency Medicine

## 2017-08-07 ENCOUNTER — Encounter: Payer: Self-pay | Admitting: *Deleted

## 2017-08-07 ENCOUNTER — Ambulatory Visit (HOSPITAL_BASED_OUTPATIENT_CLINIC_OR_DEPARTMENT_OTHER): Payer: Managed Care, Other (non HMO)

## 2017-08-07 DIAGNOSIS — R0789 Other chest pain: Secondary | ICD-10-CM | POA: Diagnosis not present

## 2017-08-07 DIAGNOSIS — R0602 Shortness of breath: Secondary | ICD-10-CM

## 2017-08-07 DIAGNOSIS — I10 Essential (primary) hypertension: Secondary | ICD-10-CM | POA: Diagnosis not present

## 2017-08-07 DIAGNOSIS — Z87891 Personal history of nicotine dependence: Secondary | ICD-10-CM | POA: Diagnosis not present

## 2017-08-07 DIAGNOSIS — R0600 Dyspnea, unspecified: Secondary | ICD-10-CM | POA: Insufficient documentation

## 2017-08-07 DIAGNOSIS — Z8249 Family history of ischemic heart disease and other diseases of the circulatory system: Secondary | ICD-10-CM | POA: Diagnosis not present

## 2017-08-07 DIAGNOSIS — J45909 Unspecified asthma, uncomplicated: Secondary | ICD-10-CM | POA: Diagnosis not present

## 2017-08-07 LAB — MYOCARDIAL PERFUSION IMAGING
CHL CUP MPHR: 162 {beats}/min
CHL CUP NUCLEAR SDS: 4
CHL CUP RESTING HR STRESS: 68 {beats}/min
CSEPED: 8 min
CSEPEW: 10.1 METS
CSEPPHR: 146 {beats}/min
Exercise duration (sec): 20 s
LHR: 0.25
LV sys vol: 41 mL
LVDIAVOL: 117 mL (ref 62–150)
Percent HR: 90 %
SRS: 0
SSS: 4
TID: 0.76

## 2017-08-07 LAB — ECHOCARDIOGRAM COMPLETE
Height: 72 in
Weight: 3792 oz

## 2017-08-07 MED ORDER — TECHNETIUM TC 99M TETROFOSMIN IV KIT
10.5000 | PACK | Freq: Once | INTRAVENOUS | Status: AC | PRN
  Administered 2017-08-07: 10.5 via INTRAVENOUS
  Filled 2017-08-07: qty 11

## 2017-08-07 MED ORDER — TECHNETIUM TC 99M TETROFOSMIN IV KIT
31.0000 | PACK | Freq: Once | INTRAVENOUS | Status: AC | PRN
  Administered 2017-08-07: 31 via INTRAVENOUS
  Filled 2017-08-07: qty 31

## 2017-08-07 NOTE — Progress Notes (Signed)
Subjective:    Patient ID: Devon Bryant, male    DOB: 1961-01-05, 56 y.o.   MRN: [id number]  HPI 56 year old former smoker who carries a diagnosis of asthma. He was given this dx about 3 weeks ago. He noted that in 03/2017 he started to develop cough, some wheeze. He was seen by PCP and then Dr Glade Lloyd with pulm. Was treated with ProAir, Breo empirically, may have helped him temporarily but he would continue to cough. Family could hear what sounds like UA noise. He underwent allergy testing > negative.  Underwent peak flow meter testing but no PFT. He was restarted on Breo. Since then has also been rx with abx x 1.   He has never had a flare this severe before.   Cardiac stress test 08/07/17 >> low risk study without any ST segment changes TTE 08/07/17 >> LVEF 55-60%, no regional wall motion abnormalities, no diastolic dysfunction, moderately dilated left atrium, mildly dilated right atrium, no PFO, normal RV size and function   Review of Systems  Constitutional: Positive for unexpected weight change. Negative for fever.  HENT: Positive for congestion, sneezing, sore throat and trouble swallowing. Negative for dental problem, ear pain, nosebleeds, postnasal drip, rhinorrhea and sinus pressure.   Eyes: Negative for redness and itching.  Respiratory: Positive for cough, chest tightness, shortness of breath and wheezing.   Cardiovascular: Positive for palpitations. Negative for leg swelling.  Gastrointestinal: Positive for nausea. Negative for vomiting.  Genitourinary: Negative for dysuria.  Musculoskeletal: Negative for joint swelling.  Skin: Negative for rash.  Allergic/Immunologic: Negative.  Negative for environmental allergies, food allergies and immunocompromised state.  Neurological: Negative for headaches.  Hematological: Does not bruise/bleed easily.  Psychiatric/Behavioral: Positive for dysphoric mood. The patient is nervous/anxious.    Past Medical History:  Diagnosis Date    . Anxiety   . Arthritis   . Asthma   . Depression   . Diverticulitis   . Diverticulosis   . Esophageal stricture   . GERD (gastroesophageal reflux disease)   . Helicobacter pylori gastritis   . Hiatal hernia   . Hypertension   . Internal hemorrhoids   . Tubular adenoma of colon 03/2014     Family History  Problem Relation Age of Onset  . Heart attack Maternal Grandfather 72  . Other Father        Car accident  . Stomach cancer Neg Hx   . Rectal cancer Neg Hx   . Colon cancer Neg Hx      Social History   Social History  . Marital status: Married    Spouse name: N/A  . Number of children: 2  . Years of education: N/A   Occupational History  . 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 Smoking status: Former Smoker    Packs/day: 0.25    Years: 15.00    Types: Cigarettes    Quit date: 10/08/2007  . Smokeless tobacco: Never Used  . Alcohol use No     Comment: Heavy drinking until 10/17/2016  . Drug use: No     Comment: Remote marijuana in 1970's.  Marland Kitchen Sexual activity: Not on file   Other Topics Concern  . Not on file   Social History Narrative  . No narrative on file  No mold.  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 albuterol (  PROVENTIL) (2.5 MG/3ML) 0.083% nebulizer solution Take 2.5 mg by nebulization as needed.     . carvedilol (COREG) 12.5 MG tablet Take 12.5 mg by mouth 2 (two) times daily with a meal.    . omeprazole (PRILOSEC) 40 MG capsule Take 40 mg by mouth daily.    Marland Kitchen PROAIR HFA 108 (90 Base) MCG/ACT inhaler Inhale 2 puffs into the lungs as needed.     . Vilazodone HCl (VIIBRYD) 40 MG TABS Take by mouth daily.     Facility-Administered Medications Prior to Visit  Medication Dose Route Frequency Provider Last Rate Last Dose  . 0.9 %  sodium chloride infusion  500 mL Intravenous Continuous Ladene Artist, MD              Objective:   Physical Exam Vitals:   08/07/17 1537  BP: (!) 152/90  Pulse: 86  SpO2: 93%  Weight: 236 lb 9.6 oz (107.3 kg)  Height: 6\' 1"  (1.854 m)   Gen: Pleasant, well-nourished, in no distress,  normal affect  ENT: No lesions,  mouth clear,  oropharynx clear, no postnasal drip, strong voice  Neck: No JVD, no stridor  Lungs: No use of accessory muscles, clear without rales or rhonchi, no wheeze on forced expiration  Cardiovascular: RRR, heart sounds normal, no murmur or gallops, no peripheral edema  Musculoskeletal: No deformities, no cyanosis or clubbing  Neuro: alert, non focal  Skin: Warm, no lesions or rashes      Assessment & Plan:  Shortness of breath Dyspnea with cough, wheezing.  Started in June but in retrospect it may be that he has had similar symptoms of lesser severity in the past.  He was diagnosed with asthma although it is not clear to me whether this was an upper airway obstruction versus lower airways.  He is now improved, is on Breo.  I would like to get full pulmonary function testing.  I will continue the Breo until we were able to do so.   Please continue Breo as you are taking it, once a day.  Keep albuterol available to use 2 puffs or 1 nebulizer treatment if needed for shortness of breath, wheezing We will arrange for pulmonary function testing.  Please stop your Breo 4 days before its time to do the test Continue your omeprazole as you are taking it.  (Esophageal reflux can make asthma or upper airway irritation harder to manage.) Follow with Dr Lamonte Sakai next available with full PFT  Baltazar Apo, MD, PhD 08/07/2017, 4:14 PM [facility] [phone] or if no answer [phone]

## 2017-08-07 NOTE — Assessment & Plan Note (Signed)
Dyspnea with cough, wheezing.  Started in June but in retrospect it may be that he has had similar symptoms of lesser severity in the past.  He was diagnosed with asthma although it is not clear to me whether this was an upper airway obstruction versus lower airways.  He is now improved, is on Breo.  I would like to get full pulmonary function testing.  I will continue the Breo until we were able to do so.   Please continue Breo as you are taking it, once a day.  Keep albuterol available to use 2 puffs or 1 nebulizer treatment if needed for shortness of breath, wheezing We will arrange for pulmonary function testing.  Please stop your Breo 4 days before its time to do the test Continue your omeprazole as you are taking it.  (Esophageal reflux can make asthma or upper airway irritation harder to manage.) Follow with Dr Lamonte Sakai next available with full PFT

## 2017-08-07 NOTE — Patient Instructions (Addendum)
Please continue Breo as you are taking it, once a day.  Keep albuterol available to use 2 puffs or 1 nebulizer treatment if needed for shortness of breath, wheezing We will arrange for pulmonary function testing.  Please stop your Breo 4 days before its time to do the test Continue your omeprazole as you are taking it.  (Esophageal reflux can make asthma or upper airway irritation harder to manage.) Follow with Dr Lamonte Sakai next available with full PFT

## 2017-09-17 ENCOUNTER — Other Ambulatory Visit: Payer: Self-pay | Admitting: Emergency Medicine

## 2017-09-17 DIAGNOSIS — R0602 Shortness of breath: Secondary | ICD-10-CM

## 2017-09-18 ENCOUNTER — Other Ambulatory Visit: Payer: Managed Care, Other (non HMO)

## 2017-09-18 ENCOUNTER — Encounter: Payer: Self-pay | Admitting: Internal Medicine

## 2017-09-18 ENCOUNTER — Ambulatory Visit (INDEPENDENT_AMBULATORY_CARE_PROVIDER_SITE_OTHER): Payer: Managed Care, Other (non HMO) | Admitting: Internal Medicine

## 2017-09-18 ENCOUNTER — Ambulatory Visit (INDEPENDENT_AMBULATORY_CARE_PROVIDER_SITE_OTHER): Payer: Managed Care, Other (non HMO) | Admitting: Emergency Medicine

## 2017-09-18 ENCOUNTER — Encounter: Payer: Self-pay | Admitting: Emergency Medicine

## 2017-09-18 VITALS — BP 124/84 | HR 98 | Ht 73.0 in | Wt 244.6 lb

## 2017-09-18 DIAGNOSIS — J449 Chronic obstructive pulmonary disease, unspecified: Secondary | ICD-10-CM

## 2017-09-18 DIAGNOSIS — K219 Gastro-esophageal reflux disease without esophagitis: Secondary | ICD-10-CM | POA: Diagnosis not present

## 2017-09-18 DIAGNOSIS — I10 Essential (primary) hypertension: Secondary | ICD-10-CM | POA: Diagnosis not present

## 2017-09-18 DIAGNOSIS — R0789 Other chest pain: Secondary | ICD-10-CM

## 2017-09-18 DIAGNOSIS — R05 Cough: Secondary | ICD-10-CM | POA: Diagnosis not present

## 2017-09-18 DIAGNOSIS — R0602 Shortness of breath: Secondary | ICD-10-CM

## 2017-09-18 DIAGNOSIS — R053 Chronic cough: Secondary | ICD-10-CM | POA: Insufficient documentation

## 2017-09-18 LAB — PULMONARY FUNCTION TEST
DL/VA % PRED: 91 %
DL/VA: 4.34 ml/min/mmHg/L
DLCO COR % PRED: 73 %
DLCO cor: 25.75 ml/min/mmHg
DLCO unc % pred: 77 %
DLCO unc: 27.22 ml/min/mmHg
FEF 25-75 Post: 1.59 L/sec
FEF 25-75 Pre: 1.43 L/sec
FEF2575-%CHANGE-POST: 10 %
FEF2575-%PRED-POST: 49 %
FEF2575-%Pred-Pre: 44 %
FEV1-%CHANGE-POST: -2 %
FEV1-%Pred-Post: 59 %
FEV1-%Pred-Pre: 61 %
FEV1-PRE: 2.4 L
FEV1-Post: 2.35 L
FEV1FVC-%Change-Post: -1 %
FEV1FVC-%Pred-Pre: 85 %
FEV6-%Change-Post: 1 %
FEV6-%PRED-POST: 74 %
FEV6-%Pred-Pre: 73 %
FEV6-PRE: 3.61 L
FEV6-Post: 3.66 L
FEV6FVC-%CHANGE-POST: 2 %
FEV6FVC-%PRED-PRE: 101 %
FEV6FVC-%Pred-Post: 104 %
FVC-%CHANGE-POST: 0 %
FVC-%PRED-POST: 70 %
FVC-%PRED-PRE: 71 %
FVC-POST: 3.66 L
FVC-PRE: 3.68 L
POST FEV1/FVC RATIO: 64 %
PRE FEV6/FVC RATIO: 98 %
Post FEV6/FVC ratio: 100 %
Pre FEV1/FVC ratio: 65 %
RV % PRED: 124 %
RV: 2.9 L
TLC % pred: 92 %
TLC: 6.84 L

## 2017-09-18 MED ORDER — FLUTICASONE FUROATE-VILANTEROL 200-25 MCG/INH IN AEPB: 1.0000 | INHALATION_SPRAY | Freq: Every day | RESPIRATORY_TRACT | 5 refills | Status: DC

## 2017-09-18 NOTE — Progress Notes (Signed)
PFT completed today 09/18/17

## 2017-09-18 NOTE — Assessment & Plan Note (Signed)
He identifies GERD as and exacerbate her both his chronic cough and his asthma.  It is under fairly good control at this time on omeprazole 40 mg daily although he does occasionally have breakthrough symptoms.  We talked about diet modification, ensuring that he does not eat close to bedtime, etc.

## 2017-09-18 NOTE — Assessment & Plan Note (Addendum)
His pulmonary function testing is consistent with obstructive lung disease.  No bronchodilator response but he does have some asthmatic features.  I believe the Brio is adequate for now although we may decide to change to a combination LABA / LAMA at some point in the future.  He needs alpha-1 antitrypsin testing we will do this today.  Continue Breo as you have been taking it.  Remember to rinse and gargle after using this medication Use albuterol, either nebulized or 2 puffs, as needed for shortness of breath, wheezing, chest tightness We will perform blood work today. Start working on  your exercise and physical conditioning Flu shot up-to-date Follow with Dr Lamonte Sakai in 6 months or sooner if you have any problems

## 2017-09-18 NOTE — Progress Notes (Signed)
Subjective:    Patient ID: Devon Bryant, male    DOB: [birth year], 56 y.o.   MRN: [id number]  Shortness of Breath  Associated symptoms include a sore throat and wheezing. Pertinent negatives include no ear pain, fever, headaches, leg swelling, rash, rhinorrhea or vomiting.   56 year old former smoker who carries a diagnosis of asthma. He was given this dx about 3 weeks ago. He noted that in 03/2017 he started to develop cough, some wheeze. He was seen by PCP and then Dr Glade Lloyd with pulm. Was treated with ProAir, Breo empirically, may have helped him temporarily but he would continue to cough. Family could hear what sounds like UA noise. He underwent allergy testing > negative.  Underwent peak flow meter testing but no PFT. He was restarted on Breo. Since then has also been rx with abx x 1.   He has never had a flare this severe before.   Cardiac stress test 08/07/17 >> low risk study without any ST segment changes TTE 08/07/17 >> LVEF 55-60%, no regional wall motion abnormalities, no diastolic dysfunction, moderately dilated left atrium, mildly dilated right atrium, no PFO, normal RV size and function  ROV 09/18/17 --this is a follow-up visit for former smoker for evaluation of dyspnea.  He also has a history of cough. He had a clinical improvement with Breo and albuterol prn > a few times a week. He has some cough, with cool air. He has some breakthrough GERD on omeprazole qd. He has some associated cough.  He underwent pulmonary function testing today that I have reviewed.  This shows moderately severe obstruction without a bronchodilator response, borderline hyperinflation, slightly decreased diffusion capacity that corrects to the normal range when adjusted for alveolar volume.   Review of Systems  Constitutional: Positive for unexpected weight change. Negative for fever.  HENT: Positive for congestion, sneezing, sore throat and trouble swallowing. Negative for dental problem, ear pain,  nosebleeds, postnasal drip, rhinorrhea and sinus pressure.   Eyes: Negative for redness and itching.  Respiratory: Positive for cough, chest tightness, shortness of breath and wheezing.   Cardiovascular: Positive for palpitations. Negative for leg swelling.  Gastrointestinal: Positive for nausea. Negative for vomiting.  Genitourinary: Negative for dysuria.  Musculoskeletal: Negative for joint swelling.  Skin: Negative for rash.  Allergic/Immunologic: Negative.  Negative for environmental allergies, food allergies and immunocompromised state.  Neurological: Negative for headaches.  Hematological: Does not bruise/bleed easily.  Psychiatric/Behavioral: Positive for dysphoric mood. The patient is nervous/anxious.    Past Medical History:  Diagnosis Date  . Anxiety   . Arthritis   . Asthma   . Depression   . Diverticulitis   . Diverticulosis   . Esophageal stricture   . GERD (gastroesophageal reflux disease)   . Helicobacter pylori gastritis   . Hiatal hernia   . Hypertension   . Internal hemorrhoids   . Tubular adenoma of colon 03/2014     Family History  Problem Relation Age of Onset  . Heart attack Maternal Grandfather 72  . Other Father        Car accident  . Stomach cancer Neg Hx   . Rectal cancer Neg Hx   . Colon cancer Neg Hx      Social History   Socioeconomic History  . Marital status: Married    Spouse name: Not on file  . Number of children: 2  . Years of education: Not on file  . Highest education level: Not on file  Social Needs  .  Financial resource strain: Not on file  . Food insecurity - worry: Not on file  . Food insecurity - inability: Not on file  . Transportation needs - medical: Not on file  . Transportation needs - non-medical: Not on file  Occupational History  . Occupation: truck Education administrator: [employer]  Tobacco Use  . Smoking status: Former Smoker    Packs/day: 0.25    Years: 15.00    Pack years: 3.75    Types: Cigarettes      Last attempt to quit: 10/08/2007    Years since quitting: 9.9  . Smokeless tobacco: Never Used  Substance and Sexual Activity  . 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 Drug use: No    Comment: Remote marijuana in 1970's.  Marland Kitchen Sexual activity: Not on file  Other Topics Concern  . Not on file  Social History Narrative  . Not on file  No mold.  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 albuterol (PROVENTIL) (2.5 MG/3ML) 0.083% nebulizer solution Take 2.5 mg by nebulization as needed.     . carvedilol (COREG) 12.5 MG tablet Take 12.5 mg by mouth 2 (two) times daily with a meal.    . omeprazole (PRILOSEC) 40 MG capsule Take 40 mg by mouth daily.    Marland Kitchen PROAIR HFA 108 (90 Base) MCG/ACT inhaler Inhale 2 puffs into the lungs as needed.     . Vilazodone HCl (VIIBRYD) 40 MG TABS Take by mouth daily.    . fluticasone furoate-vilanterol (BREO ELLIPTA) 200-25 MCG/INH AEPB Inhale 1 puff into the lungs daily.     Facility-Administered Medications Prior to Visit  Medication Dose Route Frequency Provider Last Rate Last Dose  . 0.9 %  sodium chloride infusion  500 mL Intravenous Continuous Ladene Artist, MD             Objective:   Physical Exam Vitals:   09/18/17 1007 09/18/17 1009  BP:  (!) 150/100  Pulse:  80  SpO2:  94%  Weight: 246 lb (111.6 kg)   Height: 6' (1.829 m)    Gen: Pleasant, well-nourished, in no distress,  normal affect  ENT: No lesions,  mouth clear,  oropharynx clear, no postnasal drip, strong voice  Neck: No JVD, no stridor  Lungs: No use of accessory muscles, clear without rales or rhonchi, no wheeze on forced expiration  Cardiovascular: RRR, heart sounds normal, no murmur or gallops, no peripheral edema  Musculoskeletal: No deformities, no cyanosis or clubbing  Neuro: alert, non focal  Skin: Warm, no lesions or rashes       Assessment & Plan:  COPD with asthma ([place]) His pulmonary function testing is consistent with obstructive lung disease.  No bronchodilator response but he does have some asthmatic features.  I believe the Brio is adequate for now although we may decide to change to a combination LABA / LAMA at some point in the future.  He needs alpha-1 antitrypsin testing we will do this today.  Continue Breo as you have been taking it.  Remember to rinse and gargle after using this medication Use albuterol, either nebulized or 2 puffs, as needed for shortness of breath, wheezing, chest tightness We will perform blood work today. Start working on  your exercise and physical conditioning Flu shot up-to-date Follow with Dr Lamonte Sakai in 6  months or sooner if you have any problems  GERD (gastroesophageal reflux disease) He identifies GERD as and exacerbate her both his chronic cough and his asthma.  It is under fairly good control at this time on omeprazole 40 mg daily although he does occasionally have breakthrough symptoms.  We talked about diet modification, ensuring that he does not eat close to bedtime, etc.  Chronic cough No significant allergic rhinitis.  We are focusing on controlling his GERD.  If the cough worsens then he may need increased GERD therapy.  We may also need to consider changing his Breo to avoid a dry powdered bronchodilator  Baltazar Apo, MD, PhD 09/18/2017, 11:08 AM [facility] [phone] or if no answer [phone]

## 2017-09-18 NOTE — Patient Instructions (Addendum)
Medication Instructions:  Call me tomorrow and let me know the name of your blood pressure medication. [facility] Sil[facility]mith [phone]  Labwork: None  Testing/Procedures: None   Follow-Up: Your physician wants you to follow-up in: 1 year with Dr End. (December  2019). You will receive a reminder letter in the mail two months in advance. If you don't receive a letter, please call our office to schedule the follow-up appointment.          If you need a refill on your cardiac medications before your next appointment, please call your pharmacy.

## 2017-09-18 NOTE — Patient Instructions (Signed)
Continue Brio as you have been taking it.  Remember to rinse and gargle after using this medication Use albuterol, either nebulized or 2 puffs, as needed for shortness of breath, wheezing, chest tightness We will perform blood work today. Continue your omeprazole 40 mg daily as you have been taking it Start working on  your exercise and physical conditioning Flu shot up-to-date Follow with Dr Lamonte Sakai in 6 months or sooner if you have any problems

## 2017-09-18 NOTE — Assessment & Plan Note (Signed)
No significant allergic rhinitis.  We are focusing on controlling his GERD.  If the cough worsens then he may need increased GERD therapy.  We may also need to consider changing his Breo to avoid a dry powdered bronchodilator

## 2017-09-18 NOTE — Progress Notes (Signed)
Follow-up Outpatient Visit Date: 09/18/2017  Primary Care Provider: Egbert Garibaldi, PA-C [address] [place] [address]  Chief Complaint: Follow-up shortness of breath  HPI:  Mr. Devon Bryant is a 56 y.o. year-old male with history of recently diagnosed obstructive lund disease, hypertension, GERD with esophageal stricture, depression, and anxiety, who presents for follow-up of shortness of breath. I met Mr. Greis in late October, at which time he noted progressive shortness of breath over the summer. He underwent extensive allergy testing that did not reveal a cause for his dyspnea. He was ultimately started on an as-needed albuterol inhaler with marked improvement. Given multiple cardiac risk factors, we agreed to perform an echo and nuclear stress test, which did not reveal any findings to explain his dyspnea.  Since our last visit, Mr. Schloemer has experienced further improvement in his breathing. He has seen Dr. Lamonte Sakai in the pulmonary clinic and is now using Breo for obstructive lung disease. Mr. Hobby denies chest pain, shortness of breath, palpitations, and lightheadedness. Since our last visit, his carvedilol was stopped by his PCP out of concern that it could interfere with his underlying pulmonary issues. He was started on another BP medication but does not recall the name. He has been walking 2 miles/day, 5 days per week.  --------------------------------------------------------------------------------------------------   Cardiovascular History & Procedures: Cardiovascular Problems:  Shortness of breath  Atypical chest pain  Risk Factors:  Hypertension, male gender, and age greater than 49  Cath/PCI:  None  CV Surgery:  None  EP Procedures and Devices:  None  Non-Invasive Evaluation(s):  Exercise MPI (08/07/17): Low risk study with minimal inferior thinning consistent with attenuation. No definite ischemia or scar. LVEF 65%.  Echo (08/07/17):  Normal LV size and contraction (LVEF 55-60%). Normal diastolic function. Moderate left and mild right atrial enlargement. Normal RV size and function. No significant valvular abnormalities.  Past medical and surgical history were reviewed and updated in EPIC.  Current Meds  Medication Sig  . albuterol (PROVENTIL) (2.5 MG/3ML) 0.083% nebulizer solution Take 2.5 mg by nebulization as needed.   . fluticasone furoate-vilanterol (BREO ELLIPTA) 200-25 MCG/INH AEPB Inhale 1 puff into the lungs daily.  Marland Kitchen omeprazole (PRILOSEC) 40 MG capsule Take 40 mg by mouth daily.  Marland Kitchen PROAIR HFA 108 (90 Base) MCG/ACT inhaler Inhale 2 puffs into the lungs as needed.   . Vilazodone HCl (VIIBRYD) 40 MG TABS Take by mouth daily.  . [DISCONTINUED] carvedilol (COREG) 12.5 MG tablet Take 12.5 mg by mouth 2 (two) times daily with a meal.   Current Facility-Administered Medications for the 09/18/17 encounter (Office Visit) with Pavielle Biggar, Harrell Gave, MD  Medication  . 0.9 %  sodium chloride infusion    Allergies: Patient has no known allergies.  Social History   Socioeconomic History  . Marital status: Married    Spouse name: Not on file  . Number of children: 2  . Years of education: Not on file  . Highest education level: Not on file  Social Needs  . Financial resource strain: Not on file  . Food insecurity - worry: Not on file  . Food insecurity - inability: Not on file  . Transportation needs - medical: Not on file  . Transportation needs - non-medical: Not on file  Occupational History  . Occupation: truck Education administrator: [employer]  Tobacco Use  . Smoking status: Former Smoker    Packs/day: 0.25    Years: 15.00    Pack years: 3.75  Types: Cigarettes    Last attempt to quit: 10/08/2007    Years since quitting: 9.9  . Smokeless tobacco: Never Used  Substance and Sexual Activity  . 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 Drug use: No    Comment: Remote marijuana in  1970's.  Marland Kitchen Sexual activity: Not on file  Other Topics Concern  . Not on file  Social History Narrative  . Not on file    Family History  Problem Relation Age of Onset  . Heart attack Maternal Grandfather 72  . Other Father        Car accident  . Stomach cancer Neg Hx   . Rectal cancer Neg Hx   . Colon cancer Neg Hx     Review of Systems: A 12-system review of systems was performed and was negative except as noted in the HPI.  --------------------------------------------------------------------------------------------------  Physical Exam: BP 124/84   Pulse 98   Ht _0  (1.854 m)   Wt 244 lb 9.6 oz (110.9 kg)   SpO2 93%   BMI 32.27 kg/m   General:  Obese man, seated comfortably in the exam room. HEENT: No conjunctival pallor or scleral icterus. Moist mucous membranes.  OP clear. Neck: Supple without lymphadenopathy, thyromegaly, JVD, or HJR. Lungs: Normal work of breathing. Clear to auscultation bilaterally without wheezes or crackles. Heart: Regular rate and rhythm without murmurs, rubs, or gallops. Non-displaced PMI. Abd: Bowel sounds present. Soft, NT/ND without hepatosplenomegaly Ext: No lower extremity edema. Radial, PT, and DP pulses are 2+ bilaterally. Skin: Warm and dry without rash.   No results found for: WBC, HGB, HCT, MCV, PLT  No results found for: NA, K, CL, CO2, BUN, CREATININE, GLUCOSE, ALT  No results found for: CHOL, HDL, LDLCALC, LDLDIRECT, TRIG, CHOLHDL  --------------------------------------------------------------------------------------------------  ASSESSMENT AND PLAN: Shortness of breath Symptoms improved with treatment of underlying lung disease. Echo and nuclear stress test last month were normal except for biatrial enlargement. I encouraged Mr. Banfill to remain active and to follow-up with Dr. Lamonte Sakai as arranged. No further cardiac testing or intervention at this time.  Atypical chest pain No recurrence since our last visit. Stress  test was low risk. Continue primary prevention. No further workup at this time.  Hypertension Blood pressure is well-controlled today. I asked Mr. Girten to call our office later today or tomorrow to let us know what new blood pressure medication he is taking. We will not make any changes today.  Follow-up: Return to clinic in 1 year.  Nelva Bush, MD 09/18/2017 3:13 PM

## 2017-09-19 ENCOUNTER — Encounter: Payer: Self-pay | Admitting: Internal Medicine

## 2017-09-25 LAB — ALPHA-1 ANTITRYPSIN PHENOTYPE: A1 ANTITRYPSIN SER: 125 mg/dL (ref 83–199)

## 2017-12-15 ENCOUNTER — Encounter: Payer: Self-pay | Admitting: Physician Assistant

## 2017-12-15 ENCOUNTER — Ambulatory Visit (INDEPENDENT_AMBULATORY_CARE_PROVIDER_SITE_OTHER): Payer: 59 | Admitting: Physician Assistant

## 2017-12-15 ENCOUNTER — Telehealth: Payer: Self-pay | Admitting: Emergency Medicine

## 2017-12-15 VITALS — BP 120/86 | HR 84 | Ht 72.0 in | Wt 243.0 lb

## 2017-12-15 DIAGNOSIS — Z8719 Personal history of other diseases of the digestive system: Secondary | ICD-10-CM

## 2017-12-15 DIAGNOSIS — R1314 Dysphagia, pharyngoesophageal phase: Secondary | ICD-10-CM

## 2017-12-15 MED ORDER — FLUTICASONE FUROATE-VILANTEROL 200-25 MCG/INH IN AEPB: 1.0000 | INHALATION_SPRAY | Freq: Every day | RESPIRATORY_TRACT | 0 refills | Status: DC

## 2017-12-15 MED ORDER — FLUTICASONE FUROATE-VILANTEROL 200-25 MCG/INH IN AEPB: 1.0000 | INHALATION_SPRAY | Freq: Every day | RESPIRATORY_TRACT | 5 refills | Status: AC

## 2017-12-15 NOTE — Telephone Encounter (Signed)
Called pt and advised message from the provider. Pt understood and verbalized understanding. Nothing further is needed.   Rx for [facility] sent in.

## 2017-12-15 NOTE — Patient Instructions (Signed)
You have been scheduled for an endoscopy. Please follow written instructions given to you at your visit today. If you use inhalers (even only as needed), please bring them with you on the day of your procedure. Your physician has requested that you go to www.startemmi.com and enter the access code given to you at your visit today. This web site gives a general overview about your procedure. However, you should still follow specific instructions given to you by our office regarding your preparation for the procedure.  Continue Omeprazole 40 mg daily.

## 2017-12-15 NOTE — Telephone Encounter (Signed)
I agree with restarting the Breo and following his sx for improvement. He needs to call us in about 7-10 days to let us know.

## 2017-12-15 NOTE — Telephone Encounter (Signed)
Spoke with patient. He stated that he has been having episodes of increased SOB and coughing for the past few months (since December 2018). Cough has been clear at most times but will change to a yellowish color for a few days and then back to clear. Denies any fever or body aches.  He recently flew out to [place] in February and had another SOB episode. He also has been wheezing a lot more.   Asked patient if he had been using his Breo 200. Patient stated that he did not have any more refills and his current inhaler had about 8 puffs left. Gave patient 2 samples from office. Advised him that this is his maintenance inhaler and he needs to use this regardless of how he feels each day. He verbalized understanding. He had been using it as needed.   Wishes to use [facility] in [place] on [address].   RB, please advise. Thanks!
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
## 2017-12-16 ENCOUNTER — Encounter: Payer: Self-pay | Admitting: Gastroenterology

## 2017-12-20 NOTE — Progress Notes (Signed)
Reviewed and agree with management plan.  Raeleen Winstanley T. Casondra Gasca, MD FACG 

## 2017-12-25 ENCOUNTER — Encounter: Payer: Self-pay | Admitting: Gastroenterology

## 2017-12-25 ENCOUNTER — Other Ambulatory Visit: Payer: Self-pay

## 2017-12-25 ENCOUNTER — Ambulatory Visit (AMBULATORY_SURGERY_CENTER): Payer: 59 | Admitting: Gastroenterology

## 2017-12-25 VITALS — BP 123/95 | HR 64 | Temp 97.3°F | Resp 12 | Ht 72.0 in | Wt 243.0 lb

## 2017-12-25 DIAGNOSIS — K2970 Gastritis, unspecified, without bleeding: Secondary | ICD-10-CM

## 2017-12-25 DIAGNOSIS — K222 Esophageal obstruction: Secondary | ICD-10-CM | POA: Diagnosis not present

## 2017-12-25 DIAGNOSIS — R1314 Dysphagia, pharyngoesophageal phase: Secondary | ICD-10-CM | POA: Diagnosis present

## 2017-12-25 MED ORDER — SODIUM CHLORIDE 0.9 % IV SOLN: 500.0000 mL | Freq: Once | INTRAVENOUS | Status: AC

## 2017-12-25 NOTE — Op Note (Signed)
[facility] Patient Name: Devon Bryant Procedure Date: 12/25/2017 9:08 AM MRN: [id number] Endoscopist: Ladene Artist , MD Age: 57 Referring MD:  Date of Birth: 07/18/1961 Gender: Male Account #: [id number] Procedure:                Upper GI endoscopy Indications:              Dysphagia Medicines:                Monitored Anesthesia Care Procedure:                Pre-Anesthesia Assessment:                           - Prior to the procedure, a History and Physical                            was performed, and patient medications and                            allergies were reviewed. The patient's tolerance of                            previous anesthesia was also reviewed. The risks                            and benefits of the procedure and the sedation                            options and risks were discussed with the patient.                            All questions were answered, and informed consent                            was obtained. Prior Anticoagulants: The patient has                            taken no previous anticoagulant or antiplatelet                            agents. ASA Grade Assessment: II - A patient with                            mild systemic disease. After reviewing the risks                            and benefits, the patient was deemed in                            satisfactory condition to undergo the procedure.                           After obtaining informed consent, the endoscope was  passed under direct vision. Throughout the                            procedure, the patient's blood pressure, pulse, and                            oxygen saturations were monitored continuously. The                            Model GIF-HQ190 [id number]) scope was introduced                            through the mouth, and advanced to the second part                            of duodenum. The upper GI endoscopy was                          accomplished without difficulty. The patient                            tolerated the procedure well. Scope In: Scope Out: Findings:                 One moderate benign-appearing, intrinsic stenosis                            was found at the gastroesophageal junction. This                            measured 1.2 cm (inner diameter) and was traversed.                            A guidewire was placed and the scope was withdrawn.                            Dilation was performed with a Savary dilator with                            mild resistance at 13 mm. Estimated blood loss:                            none.                           The exam of the esophagus was otherwise normal.                           Diffuse mild inflammation characterized by                            congestion (edema), erythema and granularity was                            found in the gastric fundus and in the gastric  body.                           The exam of the stomach was otherwise normal.                           The duodenal bulb and second portion of the                            duodenum were normal. Complications:            No immediate complications. Estimated Blood Loss:     Estimated blood loss: none. Impression:               - Benign-appearing esophageal stenosis. Dilated.                           - Gastritis.                           - Normal duodenal bulb and second portion of the                            duodenum.                           - No specimens collected. Recommendation:           - Patient has a contact number available for                            emergencies. The signs and symptoms of potential                            delayed complications were discussed with the                            patient. Return to normal activities tomorrow.                            Written discharge instructions were provided to the                             patient.                           - Clear liquid diet for 2 hours, then advance as                            tolerated to soft diet today. Resume prior diet                            tomorrow.                           - Continue present medications.                           -  Return to GI office in 6 weeks. Ladene Artist, MD 12/25/2017 9:27:21 AM This report has been signed electronically.

## 2017-12-25 NOTE — Patient Instructions (Signed)
**  Handouts given on Post Dilation diet and Gastritis**   YOU HAD AN ENDOSCOPIC PROCEDURE TODAY: Refer to the procedure report and other information in the discharge instructions given to you for any specific questions about what was found during the examination. If this information does not answer your questions, please call [facility] office at [phone] to clarify.   YOU SHOULD EXPECT: Some feelings of bloating in the abdomen. Passage of more gas than usual. Walking can help get rid of the air that was put into your GI tract during the procedure and reduce the bloating. If you had a lower endoscopy (such as a colonoscopy or flexible sigmoidoscopy) you may notice spotting of blood in your stool or on the toilet paper. Some abdominal soreness may be present for a day or two, also.  DIET: Your first meal following the procedure should be a light meal and then it is ok to progress to your normal diet. A half-sandwich or bowl of soup is an example of a good first meal. Heavy or fried foods are harder to digest and may make you feel nauseous or bloated. Drink plenty of fluids but you should avoid alcoholic beverages for 24 hours. If you had a esophageal dilation, please see attached instructions for diet.    ACTIVITY: Your care partner should take you home directly after the procedure. You should plan to take it easy, moving slowly for the rest of the day. You can resume normal activity the day after the procedure however YOU SHOULD NOT DRIVE, use power tools, machinery or perform tasks that involve climbing or major physical exertion for 24 hours (because of the sedation medicines used during the test).   SYMPTOMS TO REPORT IMMEDIATELY: A gastroenterologist can be reached at any hour. Please call [phone]  for any of the following symptoms:   Following upper endoscopy (EGD, EUS, ERCP, esophageal dilation) Vomiting of blood or coffee ground material  New, significant abdominal pain  New, significant  chest pain or pain under the shoulder blades  Painful or persistently difficult swallowing  New shortness of breath  Black, tarry-looking or red, bloody stools  FOLLOW UP:  If any biopsies were taken you will be contacted by phone or by letter within the next 1-3 weeks. Call [phone]  if you have not heard about the biopsies in 3 weeks.  Please also call with any specific questions about appointments or follow up tests.

## 2017-12-25 NOTE — Progress Notes (Signed)
Called to room to assist during endoscopic procedure.  Patient ID and intended procedure confirmed with present staff. Received instructions for my participation in the procedure from the performing physician.  

## 2017-12-25 NOTE — Progress Notes (Signed)
A and O x3. Report to RN. Tolerated MAC anesthesia well.Teeth unchanged after procedure.

## 2017-12-26 ENCOUNTER — Telehealth: Payer: Self-pay

## 2017-12-26 NOTE — Telephone Encounter (Signed)
   Follow up Call-  Call back number 12/25/2017 03/18/2017  Post procedure Call Back phone  # [phone]  Permission to leave phone message Yes Yes  Some recent data might be hidden     Patient questions:  Do you have a fever, pain , or abdominal swelling? No. Pain Score  0 *  Have you tolerated food without any problems? Yes.    Have you been able to return to your normal activities? Yes.    Do you have any questions about your discharge instructions: Diet   No. Medications  No. Follow up visit  No.  Do you have questions or concerns about your Care? No.  Actions: * If pain score is 4 or above: No action needed, pain <4.

## 2018-01-22 ENCOUNTER — Encounter: Payer: Self-pay | Admitting: Gastroenterology

## 2018-01-22 ENCOUNTER — Ambulatory Visit (INDEPENDENT_AMBULATORY_CARE_PROVIDER_SITE_OTHER): Payer: 59 | Admitting: Gastroenterology

## 2018-01-22 ENCOUNTER — Encounter (INDEPENDENT_AMBULATORY_CARE_PROVIDER_SITE_OTHER): Payer: Self-pay

## 2018-01-22 VITALS — BP 130/70 | HR 82 | Ht 73.0 in | Wt 234.0 lb

## 2018-01-22 DIAGNOSIS — K648 Other hemorrhoids: Secondary | ICD-10-CM

## 2018-01-22 NOTE — Patient Instructions (Signed)
Start 2 tablespoons of Benfiber daily. A fiber handout has been given to you to follow.   HEMORRHOID BANDING PROCEDURE    FOLLOW-UP CARE   1. The procedure you have had should have been relatively painless since the banding of the area involved does not have nerve endings and there is no pain sensation.  The rubber band cuts off the blood supply to the hemorrhoid and the band may fall off as soon as 48 hours after the banding (the band may occasionally be seen in the toilet bowl following a bowel movement). You may notice a temporary feeling of fullness in the rectum which should respond adequately to plain Tylenol or Motrin.  2. Following the banding, avoid strenuous exercise that evening and resume full activity the next day.  A sitz bath (soaking in a warm tub) or bidet is soothing, and can be useful for cleansing the area after bowel movements.     3. To avoid constipation, take two tablespoons of natural wheat bran, natural oat bran, flax, Benefiber or any over the counter fiber supplement and increase your water intake to 7-8 glasses daily.    4. Unless you have been prescribed anorectal medication, do not put anything inside your rectum for two weeks: No suppositories, enemas, fingers, etc.  5. Occasionally, you may have more bleeding than usual after the banding procedure.  This is often from the untreated hemorrhoids rather than the treated one.  Don't be concerned if there is a tablespoon or so of blood.  If there is more blood than this, lie flat with your bottom higher than your head and apply an ice pack to the area. If the bleeding does not stop within a half an hour or if you feel faint, call our office at [phone] or go to the emergency room.  6. Problems are not common; however, if there is a substantial amount of bleeding, severe pain, chills, fever or difficulty passing urine (very rare) or other problems, you should call us at [phone] or report to the nearest  emergency room.  7. Do not stay seated continuously for more than 2-3 hours for a day or two after the procedure.  Tighten your buttock muscles 10-15 times every two hours and take 10-15 deep breaths every 1-2 hours.  Do not spend more than a few minutes on the toilet if you cannot empty your bowel; instead re-visit the toilet at a later time.

## 2018-01-22 NOTE — Progress Notes (Signed)
PROCEDURE NOTE:  Dr. Bradley Ferris was present for the entire procedure. He instructed and assisted.   The patient presents with symptomatic grade 2 bleeding hemorrhoids, requesting rubber band ligation of his hemorrhoidal disease.  All risks, benefits and alternative forms of therapy were discussed, described and informed consent was obtained.  In the Left Lateral Decubitus position anoscopic examination revealed grade 2 hemorrhoids in the RP, RA, LL positions. The RP and RA were moderate in size and the LL was smaller. The anorectum was pre-medicated with 0.125% nitroglycerin gel and 5% lidocaine gel. The decision was made to band the RA internal hemorrhoid, and the [place] was used to perform band ligation without complication.  Digital anorectal examination was then performed to assure proper positioning of the band, and to adjust the banded tissue as required. No complications were encountered and the patient tolerated the procedure well. The patient was discharged home without pain or other issues.  Dietary and behavioral recommendations were given and along with follow-up instructions.     The following adjunctive treatments were recommended:  Benefiber TID Return in 2-3 weeks for  follow-up and possible additional banding as required.

## 2018-02-05 ENCOUNTER — Encounter: Payer: Self-pay | Admitting: Gastroenterology

## 2018-02-05 ENCOUNTER — Ambulatory Visit (INDEPENDENT_AMBULATORY_CARE_PROVIDER_SITE_OTHER): Payer: 59 | Admitting: Gastroenterology

## 2018-02-05 VITALS — BP 122/80 | HR 76 | Ht 73.0 in | Wt 239.0 lb

## 2018-02-05 DIAGNOSIS — K641 Second degree hemorrhoids: Secondary | ICD-10-CM

## 2018-02-05 NOTE — Progress Notes (Addendum)
PROCEDURE NOTE:  Procedure performed with Margarette Canada, RN from [facility] observing, instructing.  The patient presents with symptomatic grade 2 hemorrhoids, requesting rubber band ligation of his hemorrhoidal disease. Pt had small amount of BRBPR for 2 days post first banding and no other symptoms. Overall he feels symptoms have significantly improved. All risks, benefits and alternative forms of therapy were described and informed consent was obtained.   The anorectum was pre-medicated with 0.125% nitroglycerin and 5% lidocaine gel. The decision was made to band the RP internal hemorrhoid, and the [place] was used to perform band ligation without complication.  Digital anorectal examination was then performed to assure proper positioning of the band, and to adjust the banded tissue as required. The patient was discharged home without pain or other issues.  Dietary and behavioral recommendations were given and along with follow-up instructions.     The following adjunctive treatments were recommended:  The patient would like to assess response to this 2nd banding prior to a considering a 3rd banding. He previously had 1 hemorrhoidal banding which could have been the LL hemorrhoid as it was smaller at anoscopy on 4/18. Follow up prn for additional banding as required.  No complications were encountered and the patient tolerated the procedure well.

## 2018-02-05 NOTE — Patient Instructions (Signed)
Continue Bene fiber three times a day.   Please call back if you have recurrent hemorrhoid symptoms and we will schedule your next banding.   HEMORRHOID BANDING PROCEDURE    FOLLOW-UP CARE   1. The procedure you have had should have been relatively painless since the banding of the area involved does not have nerve endings and there is no pain sensation.  The rubber band cuts off the blood supply to the hemorrhoid and the band may fall off as soon as 48 hours after the banding (the band may occasionally be seen in the toilet bowl following a bowel movement). You may notice a temporary feeling of fullness in the rectum which should respond adequately to plain Tylenol or Motrin.  2. Following the banding, avoid strenuous exercise that evening and resume full activity the next day.  A sitz bath (soaking in a warm tub) or bidet is soothing, and can be useful for cleansing the area after bowel movements.     3. To avoid constipation, take two tablespoons of natural wheat bran, natural oat bran, flax, Benefiber or any over the counter fiber supplement and increase your water intake to 7-8 glasses daily.    4. Unless you have been prescribed anorectal medication, do not put anything inside your rectum for two weeks: No suppositories, enemas, fingers, etc.  5. Occasionally, you may have more bleeding than usual after the banding procedure.  This is often from the untreated hemorrhoids rather than the treated one.  Don't be concerned if there is a tablespoon or so of blood.  If there is more blood than this, lie flat with your bottom higher than your head and apply an ice pack to the area. If the bleeding does not stop within a half an hour or if you feel faint, call our office at [phone] or go to the emergency room.  6. Problems are not common; however, if there is a substantial amount of bleeding, severe pain, chills, fever or difficulty passing urine (very rare) or other problems, you should  call us at [phone] or report to the nearest emergency room.  7. Do not stay seated continuously for more than 2-3 hours for a day or two after the procedure.  Tighten your buttock muscles 10-15 times every two hours and take 10-15 deep breaths every 1-2 hours.  Do not spend more than a few minutes on the toilet if you cannot empty your bowel; instead re-visit the toilet at a later time.

## 2018-03-06 ENCOUNTER — Encounter: Payer: 59 | Admitting: Gastroenterology

## 2018-08-17 DIAGNOSIS — Z0000 Encounter for general adult medical examination without abnormal findings: Secondary | ICD-10-CM | POA: Diagnosis not present

## 2018-08-17 DIAGNOSIS — I10 Essential (primary) hypertension: Secondary | ICD-10-CM | POA: Diagnosis not present

## 2018-08-17 DIAGNOSIS — Z23 Encounter for immunization: Secondary | ICD-10-CM | POA: Diagnosis not present

## 2018-08-17 DIAGNOSIS — R0602 Shortness of breath: Secondary | ICD-10-CM | POA: Diagnosis not present

## 2018-08-17 DIAGNOSIS — Z13220 Encounter for screening for lipoid disorders: Secondary | ICD-10-CM | POA: Diagnosis not present

## 2018-08-17 DIAGNOSIS — K219 Gastro-esophageal reflux disease without esophagitis: Secondary | ICD-10-CM | POA: Diagnosis not present

## 2018-08-17 DIAGNOSIS — J45998 Other asthma: Secondary | ICD-10-CM | POA: Diagnosis not present

## 2018-08-23 IMAGING — NM NM MISC PROCEDURE
3 series · 18 of 18 positions shown · non-contrast
Comparison: none

[Series 1: stress-sum-em_(id)_sa · 6.4mm · 6.40mm/px · 6 of 64 frames shown]
[frame 6/64]
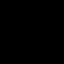
[frame 16/64]
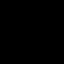
[frame 27/64]
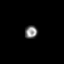
[frame 38/64]
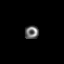
[frame 48/64]
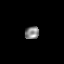
[frame 59/64]
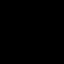

[Series 1: rest_(id)_sa · 6.4mm · 6.40mm/px · 6 of 64 frames shown]
[frame 6/64]
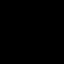
[frame 16/64]
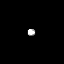
[frame 27/64]
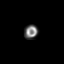
[frame 38/64]
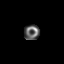
[frame 48/64]
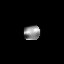
[frame 59/64]
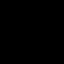

[Series 1: stress-gsp_(id)_sa · 6.4mm · 6.40mm/px · 6 of 512 frames shown]
[frame 43/512]
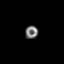
[frame 128/512]
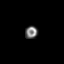
[frame 214/512]
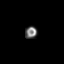
[frame 299/512]
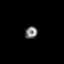
[frame 384/512]
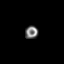
[frame 470/512]
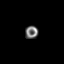

[18 of 18 positions shown; findings below may reference images not displayed]

Canned report from images found in remote index.

Refer to host system for actual result text.

## 2018-12-15 DIAGNOSIS — N951 Menopausal and female climacteric states: Secondary | ICD-10-CM | POA: Diagnosis not present

## 2018-12-15 DIAGNOSIS — R635 Abnormal weight gain: Secondary | ICD-10-CM | POA: Diagnosis not present

## 2018-12-28 DIAGNOSIS — R7301 Impaired fasting glucose: Secondary | ICD-10-CM | POA: Diagnosis not present

## 2018-12-30 DIAGNOSIS — R454 Irritability and anger: Secondary | ICD-10-CM | POA: Diagnosis not present

## 2018-12-30 DIAGNOSIS — R5383 Other fatigue: Secondary | ICD-10-CM | POA: Diagnosis not present

## 2018-12-30 DIAGNOSIS — M2550 Pain in unspecified joint: Secondary | ICD-10-CM | POA: Diagnosis not present

## 2019-01-11 DIAGNOSIS — Z6831 Body mass index (BMI) 31.0-31.9, adult: Secondary | ICD-10-CM | POA: Diagnosis not present

## 2019-01-11 DIAGNOSIS — I10 Essential (primary) hypertension: Secondary | ICD-10-CM | POA: Diagnosis not present

## 2019-01-18 DIAGNOSIS — Z6831 Body mass index (BMI) 31.0-31.9, adult: Secondary | ICD-10-CM | POA: Diagnosis not present

## 2019-01-18 DIAGNOSIS — E782 Mixed hyperlipidemia: Secondary | ICD-10-CM | POA: Diagnosis not present

## 2019-01-18 DIAGNOSIS — R7301 Impaired fasting glucose: Secondary | ICD-10-CM | POA: Diagnosis not present

## 2019-01-25 DIAGNOSIS — Z6830 Body mass index (BMI) 30.0-30.9, adult: Secondary | ICD-10-CM | POA: Diagnosis not present

## 2019-01-25 DIAGNOSIS — I10 Essential (primary) hypertension: Secondary | ICD-10-CM | POA: Diagnosis not present

## 2019-02-01 DIAGNOSIS — E291 Testicular hypofunction: Secondary | ICD-10-CM | POA: Diagnosis not present

## 2019-02-01 DIAGNOSIS — R454 Irritability and anger: Secondary | ICD-10-CM | POA: Diagnosis not present

## 2019-02-01 DIAGNOSIS — R5383 Other fatigue: Secondary | ICD-10-CM | POA: Diagnosis not present

## 2019-02-03 DIAGNOSIS — E782 Mixed hyperlipidemia: Secondary | ICD-10-CM | POA: Diagnosis not present

## 2019-02-03 DIAGNOSIS — R6882 Decreased libido: Secondary | ICD-10-CM | POA: Diagnosis not present

## 2019-02-03 DIAGNOSIS — Z6830 Body mass index (BMI) 30.0-30.9, adult: Secondary | ICD-10-CM | POA: Diagnosis not present

## 2019-02-25 DIAGNOSIS — H6691 Otitis media, unspecified, right ear: Secondary | ICD-10-CM | POA: Diagnosis not present

## 2022-04-17 ENCOUNTER — Encounter: Payer: Self-pay | Admitting: Gastroenterology
